# Patient Record
Sex: Female | Born: 1985 | Race: Black or African American | Hispanic: No | Marital: Single
Health system: Southern US, Community
[De-identification: ages and names within clinical notes are randomized; demographics above are authoritative.]

## PROBLEM LIST (undated history)

## (undated) DIAGNOSIS — I10 Essential (primary) hypertension: Secondary | ICD-10-CM

## (undated) DIAGNOSIS — B977 Papillomavirus as the cause of diseases classified elsewhere: Secondary | ICD-10-CM

## (undated) DIAGNOSIS — R002 Palpitations: Secondary | ICD-10-CM

---

## 2019-06-23 ENCOUNTER — Encounter (HOSPITAL_COMMUNITY): Payer: Self-pay | Admitting: Emergency Medicine

## 2019-06-23 ENCOUNTER — Other Ambulatory Visit: Payer: Self-pay

## 2019-06-23 ENCOUNTER — Emergency Department (HOSPITAL_COMMUNITY)
Admission: EM | Admit: 2019-06-23 | Discharge: 2019-06-23 | Disposition: A | Discharge status: Home / Self Care | Payer: Self-pay | Attending: Emergency Medicine | Admitting: Emergency Medicine

## 2019-06-23 DIAGNOSIS — Z5321 Procedure and treatment not carried out due to patient leaving prior to being seen by health care provider: Secondary | ICD-10-CM | POA: Insufficient documentation

## 2019-06-23 DIAGNOSIS — R1030 Lower abdominal pain, unspecified: Secondary | ICD-10-CM | POA: Insufficient documentation

## 2019-06-23 HISTORY — DX: Papillomavirus as the cause of diseases classified elsewhere: B97.7

## 2019-06-23 HISTORY — DX: Palpitations: R00.2

## 2019-06-23 HISTORY — DX: Essential (primary) hypertension: I10

## 2019-06-23 LAB — CBC
HCT: 35.7 % — ABNORMAL LOW (ref 36.0–46.0)
Hemoglobin: 11.8 g/dL — ABNORMAL LOW (ref 12.0–15.0)
MCH: 30 pg (ref 26.0–34.0)
MCHC: 33.1 g/dL (ref 30.0–36.0)
MCV: 90.8 fL (ref 80.0–100.0)
Platelets: 266 10*3/uL (ref 150–400)
RBC: 3.93 MIL/uL (ref 3.87–5.11)
RDW: 14.3 % (ref 11.5–15.5)
WBC: 5.7 10*3/uL (ref 4.0–10.5)
nRBC: 0 % (ref 0.0–0.2)

## 2019-06-23 LAB — COMPREHENSIVE METABOLIC PANEL
ALT: 16 U/L (ref 0–44)
AST: 18 U/L (ref 15–41)
Albumin: 3.4 g/dL — ABNORMAL LOW (ref 3.5–5.0)
Alkaline Phosphatase: 58 U/L (ref 38–126)
Anion gap: 10 (ref 5–15)
BUN: 13 mg/dL (ref 6–20)
CO2: 23 mmol/L (ref 22–32)
Calcium: 8.9 mg/dL (ref 8.9–10.3)
Chloride: 107 mmol/L (ref 98–111)
Creatinine, Ser: 1.09 mg/dL — ABNORMAL HIGH (ref 0.44–1.00)
GFR calc Af Amer: >60 mL/min (ref >60)
GFR calc non Af Amer: >60 mL/min (ref >60)
Glucose, Bld: 110 mg/dL — ABNORMAL HIGH (ref 70–99)
Potassium: 3.5 mmol/L (ref 3.5–5.1)
Sodium: 140 mmol/L (ref 135–145)
Total Bilirubin: 0.4 mg/dL (ref 0.3–1.2)
Total Protein: 6.9 g/dL (ref 6.5–8.1)

## 2019-06-23 LAB — LIPASE, BLOOD: Lipase: 33 U/L (ref 11–51)

## 2019-06-23 NOTE — ED Notes (Signed)
Pt called for room x3. No answer. 

## 2019-06-23 NOTE — ED Triage Notes (Signed)
Pt endorses lower abd pain for a week with mild nausea. Pain with voiding.

## 2019-06-23 NOTE — ED Notes (Signed)
BhCG <5. ISTAT not crossing over.  

## 2019-06-24 LAB — I-STAT BETA HCG BLOOD, ED (MC, WL, AP ONLY): I-stat hCG, quantitative: <5 m[IU]/mL (ref <5)

## 2019-08-11 ENCOUNTER — Emergency Department (HOSPITAL_COMMUNITY)
Admission: EM | Admit: 2019-08-11 | Discharge: 2019-08-12 | Disposition: A | Discharge status: Home / Self Care | Payer: Self-pay | Attending: Emergency Medicine | Admitting: Emergency Medicine

## 2019-08-11 ENCOUNTER — Emergency Department (HOSPITAL_COMMUNITY): Payer: Self-pay

## 2019-08-11 ENCOUNTER — Encounter (HOSPITAL_COMMUNITY): Payer: Self-pay | Admitting: Emergency Medicine

## 2019-08-11 DIAGNOSIS — R112 Nausea with vomiting, unspecified: Secondary | ICD-10-CM | POA: Insufficient documentation

## 2019-08-11 DIAGNOSIS — Z79899 Other long term (current) drug therapy: Secondary | ICD-10-CM | POA: Insufficient documentation

## 2019-08-11 DIAGNOSIS — I10 Essential (primary) hypertension: Secondary | ICD-10-CM | POA: Insufficient documentation

## 2019-08-11 DIAGNOSIS — N730 Acute parametritis and pelvic cellulitis: Secondary | ICD-10-CM | POA: Insufficient documentation

## 2019-08-11 LAB — CBC WITH DIFFERENTIAL/PLATELET
Abs Immature Granulocytes: 0.03 10*3/uL (ref 0.00–0.07)
Basophils Absolute: 0 10*3/uL (ref 0.0–0.1)
Basophils Relative: 0 %
Eosinophils Absolute: 0 10*3/uL (ref 0.0–0.5)
Eosinophils Relative: 0 %
HCT: 38 % (ref 36.0–46.0)
Hemoglobin: 13.1 g/dL (ref 12.0–15.0)
Immature Granulocytes: 0 %
Lymphocytes Relative: 12 %
Lymphs Abs: 1.2 10*3/uL (ref 0.7–4.0)
MCH: 30.6 pg (ref 26.0–34.0)
MCHC: 34.5 g/dL (ref 30.0–36.0)
MCV: 88.8 fL (ref 80.0–100.0)
Monocytes Absolute: 0.2 10*3/uL (ref 0.1–1.0)
Monocytes Relative: 2 %
Neutro Abs: 8.6 10*3/uL — ABNORMAL HIGH (ref 1.7–7.7)
Neutrophils Relative %: 86 %
Platelets: 201 10*3/uL (ref 150–400)
RBC: 4.28 MIL/uL (ref 3.87–5.11)
RDW: 14 % (ref 11.5–15.5)
WBC: 10 10*3/uL (ref 4.0–10.5)
nRBC: 0 % (ref 0.0–0.2)

## 2019-08-11 LAB — WET PREP, GENITAL
Clue Cells Wet Prep HPF POC: NONE SEEN
Sperm: NONE SEEN
Trich, Wet Prep: NONE SEEN
Yeast Wet Prep HPF POC: NONE SEEN

## 2019-08-11 LAB — COMPREHENSIVE METABOLIC PANEL
ALT: 23 U/L (ref 0–44)
AST: 27 U/L (ref 15–41)
Albumin: 4.5 g/dL (ref 3.5–5.0)
Alkaline Phosphatase: 70 U/L (ref 38–126)
Anion gap: 14 (ref 5–15)
BUN: 12 mg/dL (ref 6–20)
CO2: 22 mmol/L (ref 22–32)
Calcium: 9.4 mg/dL (ref 8.9–10.3)
Chloride: 106 mmol/L (ref 98–111)
Creatinine, Ser: 0.92 mg/dL (ref 0.44–1.00)
GFR calc Af Amer: >60 mL/min (ref >60)
GFR calc non Af Amer: >60 mL/min (ref >60)
Glucose, Bld: 136 mg/dL — ABNORMAL HIGH (ref 70–99)
Potassium: 4.5 mmol/L (ref 3.5–5.1)
Sodium: 142 mmol/L (ref 135–145)
Total Bilirubin: 0.2 mg/dL — ABNORMAL LOW (ref 0.3–1.2)
Total Protein: 9 g/dL — ABNORMAL HIGH (ref 6.5–8.1)

## 2019-08-11 LAB — LIPASE, BLOOD: Lipase: 22 U/L (ref 11–51)

## 2019-08-11 LAB — I-STAT BETA HCG BLOOD, ED (MC, WL, AP ONLY): I-stat hCG, quantitative: <5 m[IU]/mL (ref <5)

## 2019-08-11 MED ORDER — SODIUM CHLORIDE 0.9 % IV BOLUS
1000.0000 mL | Freq: Once | INTRAVENOUS | Status: AC
  Administered 2019-08-11: 1000 mL via INTRAVENOUS

## 2019-08-11 MED ORDER — DOXYCYCLINE HYCLATE 100 MG PO CAPS
100.0000 mg | ORAL_CAPSULE | Freq: Two times a day (BID) | ORAL | 0 refills | Status: AC
Start: 2019-08-11 — End: ?

## 2019-08-11 MED ORDER — SODIUM CHLORIDE 0.9 % IV SOLN
2.0000 g | Freq: Once | INTRAVENOUS | Status: AC
  Administered 2019-08-12: 2 g via INTRAVENOUS
  Filled 2019-08-11: qty 0.5

## 2019-08-11 MED ORDER — HYDROMORPHONE HCL 1 MG/ML IJ SOLN
1.0000 mg | Freq: Once | INTRAMUSCULAR | Status: AC
  Administered 2019-08-11: 1 mg via INTRAVENOUS
  Filled 2019-08-11: qty 1

## 2019-08-11 MED ORDER — SODIUM CHLORIDE 0.9 % IV SOLN
100.0000 mg | Freq: Once | INTRAVENOUS | Status: AC
  Administered 2019-08-12: 100 mg via INTRAVENOUS
  Filled 2019-08-11: qty 100

## 2019-08-11 MED ORDER — ONDANSETRON HCL 4 MG/2ML IJ SOLN
4.0000 mg | Freq: Once | INTRAMUSCULAR | Status: AC
  Administered 2019-08-11: 4 mg via INTRAVENOUS
  Filled 2019-08-11: qty 2

## 2019-08-11 NOTE — ED Provider Notes (Signed)
[facility]-EMERGENCY DEPT Provider Note   CSN: [id number] Arrival date & time: 08/11/19  1225     History Chief Complaint  Patient presents with  . Abdominal Pain    Samantha Lane is a 34 y.o. female.   Abdominal Pain Pain location:  Generalized Pain quality: aching and cramping   Pain radiates to:  Does not radiate Pain severity:  Severe Onset quality:  Gradual Duration:  1 day Timing:  Constant Progression:  Worsening Chronicity:  New Context comment:  Thinks she has trichamonas Associated symptoms: nausea and vomiting   Associated symptoms: no chest pain, no chills, no cough, no diarrhea, no dysuria, no fever, no shortness of breath, no vaginal bleeding and no vaginal discharge        Past Medical History:  Diagnosis Date  . HPV (human papilloma virus) infection   . Hypertension   . Palpitations     There are no problems to display for this patient.   History reviewed. No pertinent surgical history.   OB History   No obstetric history on file.     No family history on file.  Social History   Tobacco Use  . Smoking status: Not on file  Substance Use Topics  . Alcohol use: Not on file  . Drug use: Not on file    Home Medications Prior to Admission medications   Medication Sig Start Date End Date Taking? Authorizing Provider  acyclovir (ZOVIRAX) 200 MG capsule Take 1,000 mg by mouth daily.    Yes [provider]  cyclobenzaprine (FLEXERIL) 5 MG tablet Take 5 mg by mouth 3 (three) times daily as needed for muscle spasms.   Yes [provider]  hydrochlorothiazide (HYDRODIURIL) 25 MG tablet Take 25 mg by mouth daily.   Yes [provider]  hydrOXYzine (ATARAX/VISTARIL) 25 MG tablet Take 25 mg by mouth daily.    Yes [provider]  linezolid (ZYVOX) 600 MG tablet Take 600 mg by mouth 2 (two) times daily.  05/12/19  Yes [provider]  Multiple Vitamin (THERA) TABS Take 1 tablet by  mouth daily.    Yes [provider]  oxyCODONE (ROXICODONE) 5 MG immediate release tablet Take 5 mg by mouth every 4 (four) hours as needed for severe pain.   Yes [provider]  silver sulfADIAZINE (SILVADENE) 1 % cream Apply 1 application topically in the morning, at noon, and at bedtime.  05/12/19 05/11/20 Yes [provider]  venlafaxine (EFFEXOR) 75 MG tablet Take 75 mg by mouth daily.   Yes [provider]    Allergies    Sulfites, Iodine, and Tomato  Review of Systems   Review of Systems  Constitutional: Negative for chills and fever.  HENT: Negative for congestion and rhinorrhea.   Respiratory: Negative for cough and shortness of breath.   Cardiovascular: Negative for chest pain and palpitations.  Gastrointestinal: Positive for abdominal pain, nausea and vomiting. Negative for diarrhea.  Genitourinary: Negative for difficulty urinating, dysuria, vaginal bleeding and vaginal discharge.  Musculoskeletal: Negative for arthralgias and back pain.  Skin: Negative for rash and wound.  Neurological: Negative for light-headedness and headaches.    Physical Exam Updated Vital Signs BP 121/70 (BP Location: Left Arm)   Pulse 67   Temp 98.7 F (37.1 C) (Oral)   Resp 15   Ht 5\' 7"  (1.702 m)   Wt (!) 158.8 kg   SpO2 100%   BMI 54.82 kg/m   Physical Exam Vitals and nursing  note reviewed. Exam conducted with a chaperone present.  Constitutional:      General: She is not in acute distress.    Appearance: Normal appearance.  HENT:     Head: Normocephalic and atraumatic.     Nose: No rhinorrhea.  Eyes:     General:        Right eye: No discharge.        Left eye: No discharge.     Conjunctiva/sclera: Conjunctivae normal.  Cardiovascular:     Rate and Rhythm: Normal rate. Rhythm irregular.  Pulmonary:     Effort: Pulmonary effort is normal. No respiratory distress.     Breath sounds: No stridor.  Abdominal:     General: Abdomen is flat.  There is no distension.     Palpations: Abdomen is soft.     Tenderness: There is generalized abdominal tenderness. There is no rebound.  Genitourinary:    Comments: Blood from the cervical os and purulent drainage from the cervical os cervical motion tenderness, no adnexal fullness. Musculoskeletal:        General: No tenderness or signs of injury.  Skin:    General: Skin is warm and dry.  Neurological:     General: No focal deficit present.     Mental Status: She is alert. Mental status is at baseline.     Motor: No weakness.  Psychiatric:        Mood and Affect: Mood normal.        Behavior: Behavior normal.     ED Results / Procedures / Treatments   Labs (all labs ordered are listed, but only abnormal results are displayed) Labs Reviewed  WET PREP, GENITAL - Abnormal; Notable for the following components:      Result Value   WBC, Wet Prep HPF POC MODERATE (*)    All other components within normal limits  COMPREHENSIVE METABOLIC PANEL - Abnormal; Notable for the following components:   Glucose, Bld 136 (*)    Total Protein 9.0 (*)    Total Bilirubin 0.2 (*)    All other components within normal limits  CBC WITH DIFFERENTIAL/PLATELET - Abnormal; Notable for the following components:   Neutro Abs 8.6 (*)    All other components within normal limits  LIPASE, BLOOD  URINALYSIS, ROUTINE W REFLEX MICROSCOPIC  I-STAT BETA HCG BLOOD, ED ([facility], [facility], [facility] ONLY)  GC/CHLAMYDIA PROBE AMP ([place]) NOT AT [facility]    EKG None  Radiology No results found.  Procedures Procedures (including critical care time) Procedure note: Ultrasound Guided Peripheral IV  Ultrasound guided 20 g peripheral 1.88 inch angiocath IV placement performed by me. Indications: Nursing unable to place IV. Details: The right antecubital fossa and upper arm was evaluated with a multifrequency linear probe. Several patent brachial veins are noted. 1 attempts were made to cannulate a vein under realtime US  guidance with successful cannulation of the vein and catheter placement. There is return of non-pulsatile dark red blood. The patient tolerated the procedure well without complications. An ultrasound image is archived.   Medications Ordered in ED Medications  cefOXitin (MEFOXIN) 2 g in sodium chloride 0.9 % 100 mL IVPB (has no administration in time range)  doxycycline (VIBRAMYCIN) 100 mg in sodium chloride 0.9 % 250 mL IVPB (has no administration in time range)  ondansetron (ZOFRAN) injection 4 mg (4 mg Intravenous Given 08/11/19 2221)  HYDROmorphone (DILAUDID) injection 1 mg (1 mg Intravenous Given 08/11/19 2220)  sodium chloride 0.9 % bolus 1,000 mL (1,000  mLs Intravenous New Bag/Given 08/11/19 2220)    ED Course  I have reviewed the triage vital signs and the nursing notes.  Pertinent labs & imaging results that were available during my care of the patient were reviewed by me and considered in my medical decision making (see chart for details).    MDM Rules/Calculators/A&P                          1 day of worsening abdominal pain nausea vomiting.  She is concerned she has trichomonas as she has had this in the past.  She is hemodynamically stable she is afebrile her abdomen is soft without peritoneal signs.  IVs attempted by nursing semen unsuccessful placed by myself with ultrasound guidance.  Labs are sent.  Pain control nausea control fluids are given.  She is intermittently resting comfortably.  CT scan will be performed.  If CT scan is negative she will have a pelvic work-up include pelvic swab and ultrasound.  Review of laboratory studies shows no liver or pancreatic or kidney dysfunction.  No anemia or leukocytosis.  Pregnancy test is negative.  Still waiting for imaging and pelvic exam.  Pelvic exam is concerning for PID as she is cervical motion tenderness and purulent drainage from the cervical os.  No adnexal tenderness however she will get an ultrasound.  IV antibiotics for PID  will be given.  She is feeling better can tolerate p.o. and has a negative ultrasound she can be discharged home.  Pt care was handed off to on coming provider at 2330.  Complete history and physical and current plan have been communicated.  Please refer to their note for the remainder of ED care and ultimate disposition.   Final Clinical Impression(s) / ED Diagnoses Final diagnoses:  None    Rx / DC Orders ED Discharge Orders    None       Sabino Donovan, MD 08/11/19 2330

## 2019-08-11 NOTE — ED Triage Notes (Signed)
Patient about to eat some cookies-states she has GI issues, can's see GI until August

## 2019-08-11 NOTE — ED Notes (Signed)
Attempted blood-work unsuccessful stick, Pt said she will wait until she gets in the room because they have to use a ultrasound IV on her every-time she visits.

## 2019-08-11 NOTE — ED Triage Notes (Signed)
Per EMS, coming from work-complaining of N/V and abdominal pain-has been treated for an abdominal virus-finished meds

## 2019-08-12 ENCOUNTER — Encounter (HOSPITAL_COMMUNITY): Payer: Self-pay | Admitting: Emergency Medicine

## 2019-08-12 LAB — URINALYSIS, ROUTINE W REFLEX MICROSCOPIC
Bilirubin Urine: NEGATIVE
Glucose, UA: NEGATIVE mg/dL
Ketones, ur: NEGATIVE mg/dL
Nitrite: POSITIVE — AB
Protein, ur: NEGATIVE mg/dL
Specific Gravity, Urine: 1.016 (ref 1.005–1.030)
pH: 5 (ref 5.0–8.0)

## 2019-08-12 LAB — GC/CHLAMYDIA PROBE AMP (~~LOC~~) NOT AT ARMC
Chlamydia: NEGATIVE
Comment: NEGATIVE
Comment: NORMAL
Neisseria Gonorrhea: NEGATIVE

## 2019-08-12 MED ORDER — HALOPERIDOL LACTATE 5 MG/ML IJ SOLN
2.0000 mg | Freq: Once | INTRAMUSCULAR | Status: AC
  Administered 2019-08-12: 2 mg via INTRAVENOUS
  Filled 2019-08-12: qty 1

## 2019-08-12 MED ORDER — ONDANSETRON 8 MG PO TBDP
ORAL_TABLET | ORAL | 0 refills | Status: AC
Start: 2019-08-12 — End: ?

## 2019-08-12 NOTE — ED Notes (Signed)
Patient was given ginger ale to PO challenge, no episodes of N/V.

## 2020-11-15 IMAGING — US US ART/VEN ABD/PELV/SCROTUM DOPPLER LTD
1 series · 13 of 25 positions shown · non-contrast
Comparison: None.

CLINICAL DATA: Pain evaluate for tubo-ovarian abscess, likely PID
8.8 x 4.0 x 4.1 cm

EXAM:
TRANSABDOMINAL AND TRANSVAGINAL ULTRASOUND OF PELVIS
DOPPLER ULTRASOUND OF OVARIES
TECHNIQUE: Both transabdominal and transvaginal ultrasound examinations of the
pelvis were performed. Transabdominal technique was performed for
global imaging of the pelvis including uterus, ovaries, adnexal
regions, and pelvic cul-de-sac.
It was necessary to proceed with endovaginal exam following the
transabdominal exam to visualize the ovaries. Color and duplex
Doppler ultrasound was utilized to evaluate blood flow to the
ovaries.

[Series 1: us art/ven abd/pelv/scrotum doppler ltd · 54 acquisitions, 13 frames shown]
[im 1/54]
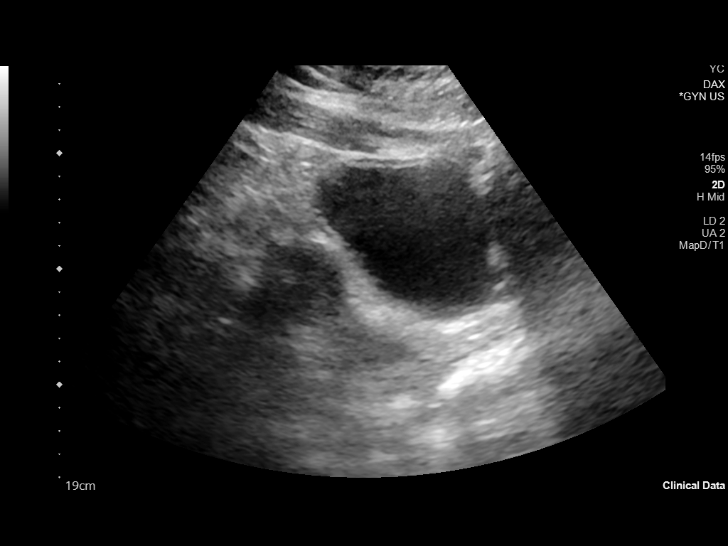
[im 5/54]
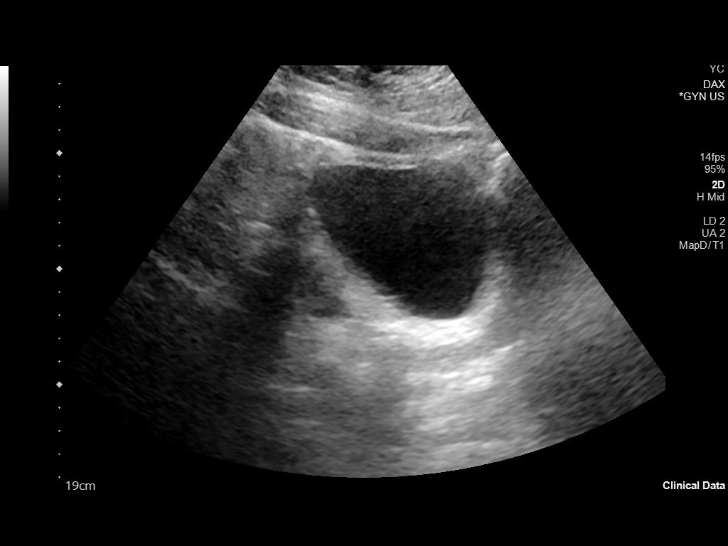
[im 9/54]
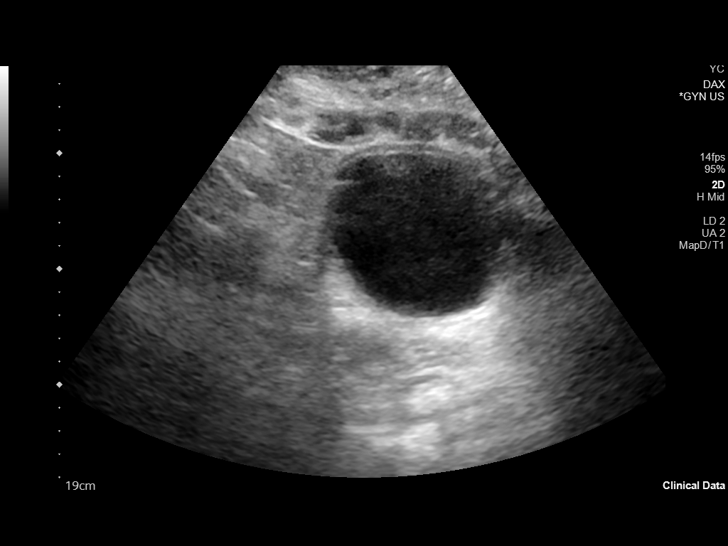
[im 14/54]
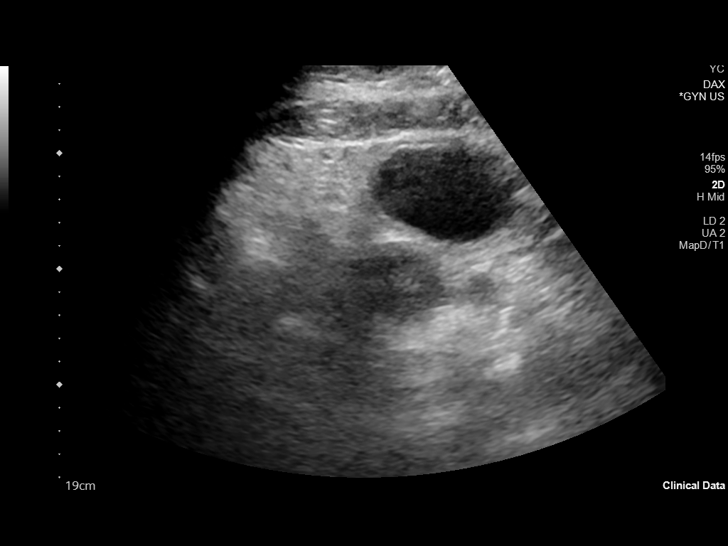
[im 18/54]
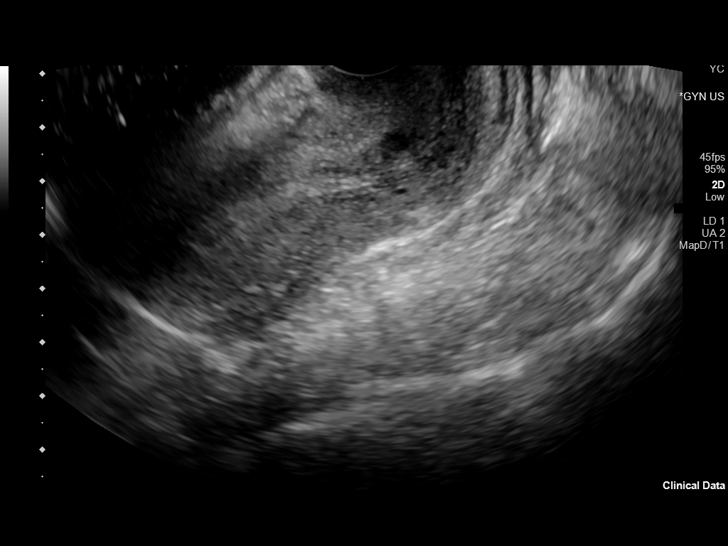
[im 23/54]
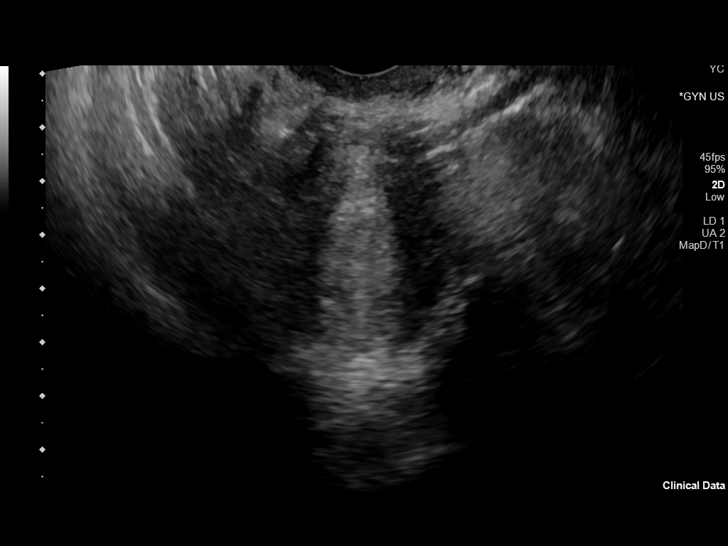
[im 27/54]
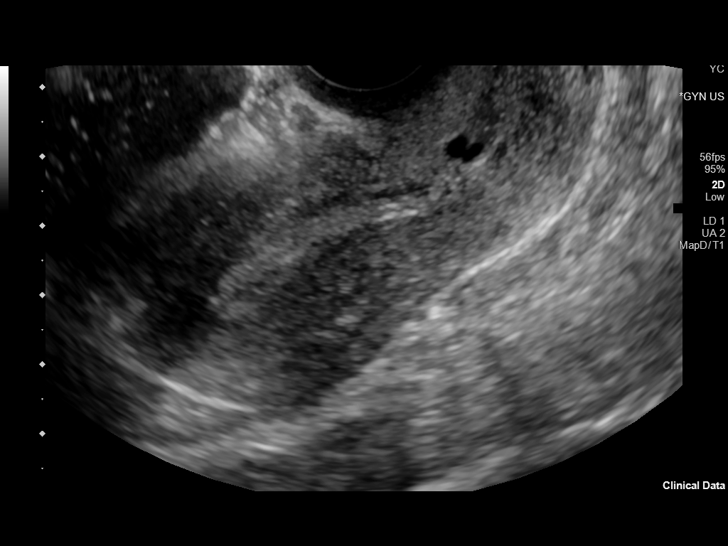
[im 31/54]
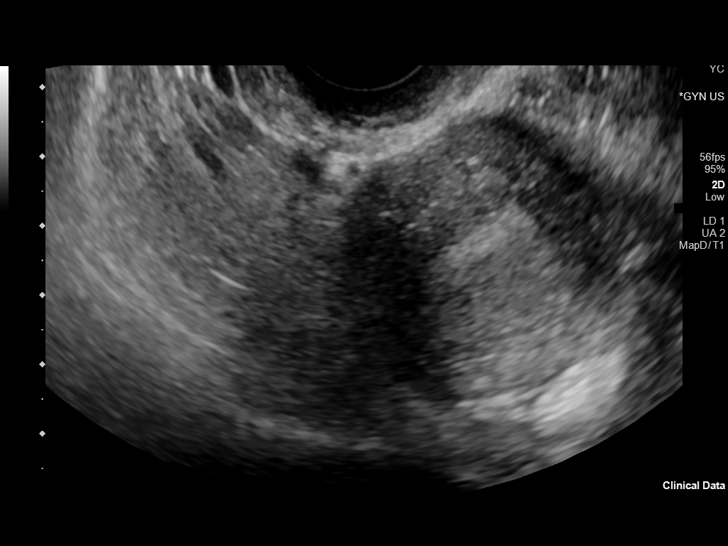
[im 36/54]
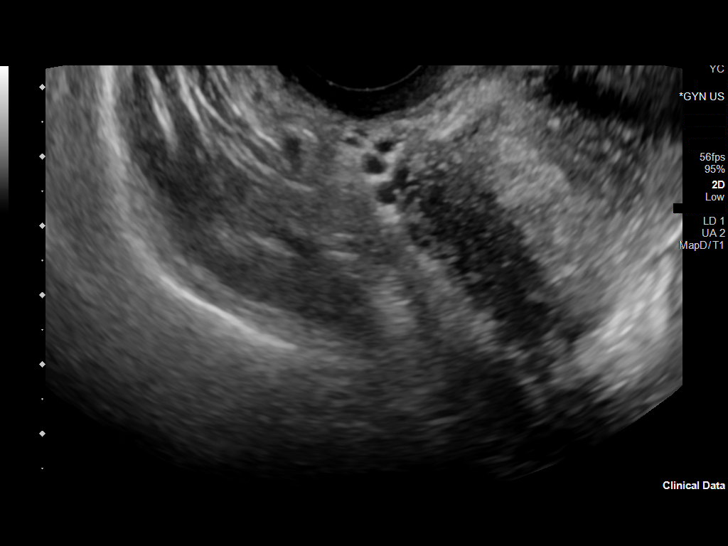
[im 40/54]
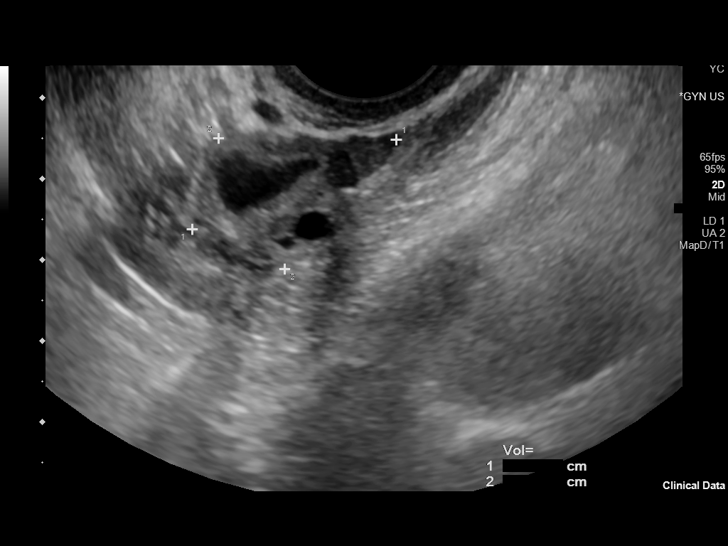
[im 45/54]
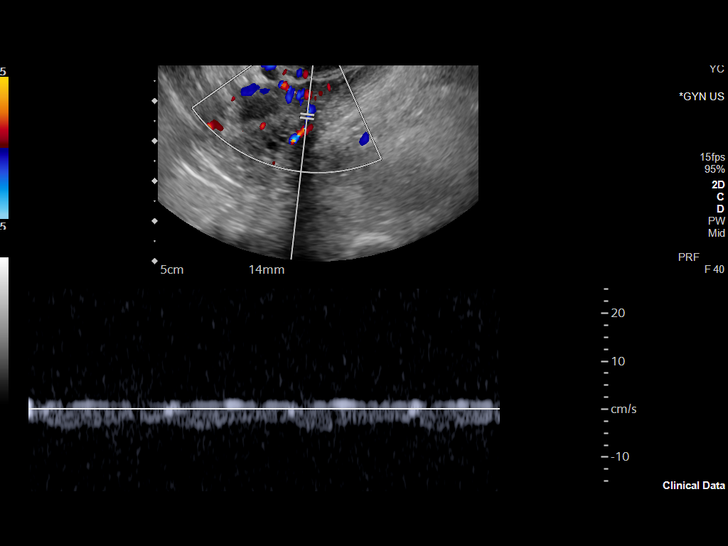
[im 49/54]
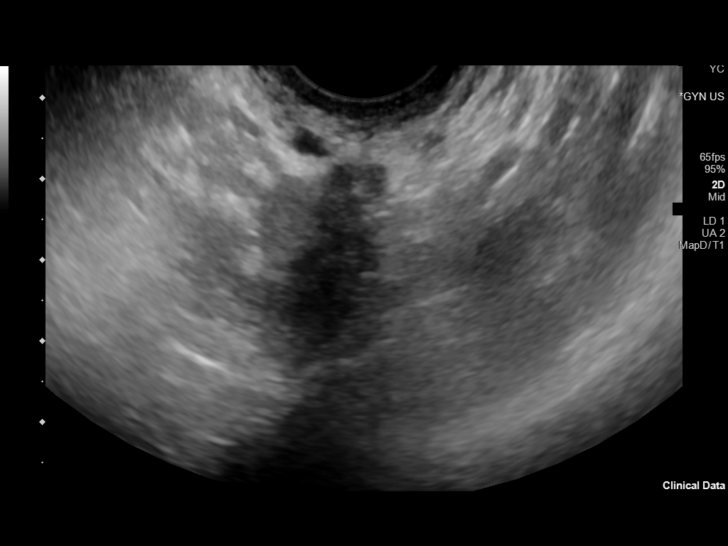
[im 54/54]
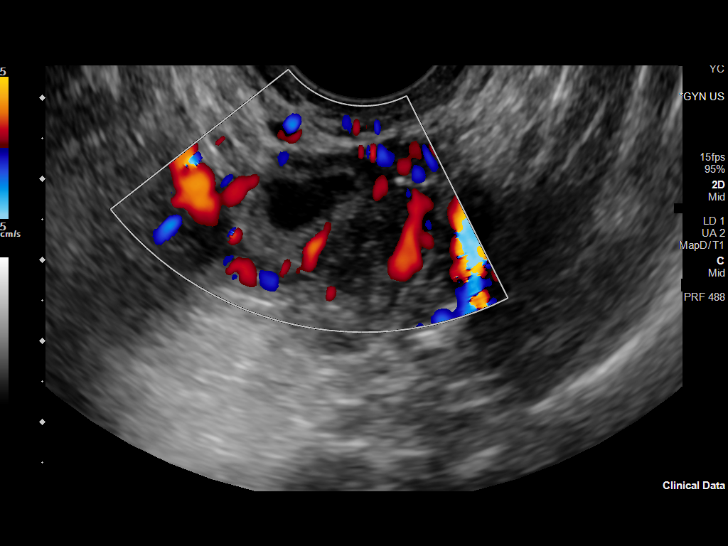

[13 of 25 positions shown; findings below may reference images not displayed]

FINDINGS: Uterus

Measurements: Size in 3 dimensions = volume: 15 mL. No fibroids or
other mass visualized.

Endometrium

Thickness: 7.2 mm.  No focal abnormality visualized.

Right ovary

Nonvisualized

Left ovary

Measurements: 2.8 x 1.8 x 2.4 cm = volume: 6.3 mL. Normal
appearance/no adnexal mass.

Pulsed Doppler evaluation of left ovary demonstrates normal
low-resistance arterial and venous waveforms.

Other findings

No abnormal free fluid.
IMPRESSION: Normal appearing uterus and left ovary.

Nonvisualized right ovary
